# Patient Record
Sex: Female | Born: 1970 | Race: Black or African American | Hispanic: No | Marital: Married | Smoking: Never smoker
Health system: Southern US, Community
[De-identification: ages and names within clinical notes are randomized; demographics above are authoritative.]

---

## 1998-12-23 ENCOUNTER — Other Ambulatory Visit: Admission: RE | Admit: 1998-12-23 | Discharge: 1998-12-23 | Payer: Self-pay | Admitting: Family Medicine

## 1999-06-20 ENCOUNTER — Other Ambulatory Visit: Admission: RE | Admit: 1999-06-20 | Discharge: 1999-06-20 | Payer: Self-pay | Admitting: Family Medicine

## 2000-05-29 ENCOUNTER — Other Ambulatory Visit: Admission: RE | Admit: 2000-05-29 | Discharge: 2000-05-29 | Payer: Self-pay | Admitting: Family Medicine

## 2015-04-12 ENCOUNTER — Ambulatory Visit
Admit: 2015-04-12 | Disposition: A | Discharge status: Home / Self Care | Payer: Self-pay | Attending: Internal Medicine | Admitting: Internal Medicine

## 2015-04-26 ENCOUNTER — Ambulatory Visit
Admission: RE | Admit: 2015-04-26 | Discharge: 2015-04-26 | Disposition: A | Discharge status: Home / Self Care | Payer: Managed Care, Other (non HMO) | Source: Ambulatory Visit | Attending: Internal Medicine | Admitting: Internal Medicine

## 2015-04-26 DIAGNOSIS — Z1231 Encounter for screening mammogram for malignant neoplasm of breast: Secondary | ICD-10-CM | POA: Diagnosis not present

## 2015-04-26 DIAGNOSIS — Z1239 Encounter for other screening for malignant neoplasm of breast: Secondary | ICD-10-CM

## 2015-09-24 ENCOUNTER — Other Ambulatory Visit: Payer: Self-pay | Admitting: Internal Medicine

## 2015-09-24 DIAGNOSIS — Z1239 Encounter for other screening for malignant neoplasm of breast: Secondary | ICD-10-CM

## 2016-05-04 ENCOUNTER — Other Ambulatory Visit: Payer: Self-pay | Admitting: Internal Medicine

## 2016-05-04 DIAGNOSIS — Z1239 Encounter for other screening for malignant neoplasm of breast: Secondary | ICD-10-CM

## 2016-05-16 ENCOUNTER — Ambulatory Visit
Admission: RE | Admit: 2016-05-16 | Discharge: 2016-05-16 | Disposition: A | Discharge status: Home / Self Care | Payer: Managed Care, Other (non HMO) | Source: Ambulatory Visit | Attending: Internal Medicine | Admitting: Internal Medicine

## 2016-05-16 DIAGNOSIS — Z1239 Encounter for other screening for malignant neoplasm of breast: Secondary | ICD-10-CM

## 2016-05-16 DIAGNOSIS — Z1231 Encounter for screening mammogram for malignant neoplasm of breast: Secondary | ICD-10-CM | POA: Diagnosis present

## 2017-05-11 ENCOUNTER — Emergency Department
Admission: EM | Admit: 2017-05-11 | Discharge: 2017-05-11 | Disposition: A | Discharge status: Home / Self Care | Payer: Commercial Managed Care - PPO | Attending: Emergency Medicine | Admitting: Emergency Medicine

## 2017-05-11 ENCOUNTER — Encounter: Payer: Self-pay | Admitting: Emergency Medicine

## 2017-05-11 DIAGNOSIS — W57XXXA Bitten or stung by nonvenomous insect and other nonvenomous arthropods, initial encounter: Secondary | ICD-10-CM | POA: Insufficient documentation

## 2017-05-11 DIAGNOSIS — Y939 Activity, unspecified: Secondary | ICD-10-CM | POA: Insufficient documentation

## 2017-05-11 DIAGNOSIS — S20461A Insect bite (nonvenomous) of right back wall of thorax, initial encounter: Secondary | ICD-10-CM | POA: Insufficient documentation

## 2017-05-11 DIAGNOSIS — Y929 Unspecified place or not applicable: Secondary | ICD-10-CM | POA: Diagnosis not present

## 2017-05-11 DIAGNOSIS — Y999 Unspecified external cause status: Secondary | ICD-10-CM | POA: Insufficient documentation

## 2017-05-11 MED ORDER — DOXYCYCLINE HYCLATE 100 MG PO TABS
200.0000 mg | ORAL_TABLET | Freq: Once | ORAL | Status: AC
  Administered 2017-05-11: 200 mg via ORAL

## 2017-05-11 MED ORDER — DOXYCYCLINE HYCLATE 100 MG PO TABS
ORAL_TABLET | ORAL | Status: AC
  Filled 2017-05-11: qty 2

## 2017-05-11 NOTE — ED Triage Notes (Signed)
Noticed tic to right upper back on Wednesday.  Here for removal

## 2017-05-11 NOTE — ED Provider Notes (Signed)
[facility] Emergency Department Provider Note       Time seen: ----------------------------------------- 7:02 PM on 05/11/2017 -----------------------------------------     I have reviewed the triage vital signs and the nursing notes.   HISTORY   Chief Complaint Tick Removal    HPI Julie Hodges is a 46 y.o. female who presents to the ED for possible tick embedded in her right upper back. This was noticed by her on about Wednesday. Her husband states the head is likely still embedded. She is here for tick removal. She denies fevers, chills, rash or other complaints.   History reviewed. No pertinent past medical history.  There are no active problems to display for this patient.   History reviewed. No pertinent surgical history.  Allergies Patient has no known allergies.  Social History Social History  Substance Use Topics  . Smoking status: Never Smoker  . Smokeless tobacco: Never Used  . Alcohol use Not on file    Review of Systems Constitutional: Negative for fever. Cardiovascular: Negative for chest pain. Respiratory: Negative for shortness of breath. Gastrointestinal: Negative for abdominal pain, vomiting and diarrhea. Musculoskeletal: Negative for back pain. Skin: Possible foreign body in the right upper back Neurological: Negative for headaches, focal weakness or numbness.  All systems negative/normal/unremarkable except as stated in the HPI  ____________________________________________   PHYSICAL EXAM:  VITAL SIGNS: ED Triage Vitals  Enc Vitals Group     BP 05/11/17 1830 (!) 159/91     Pulse Rate 05/11/17 1830 99     Resp 05/11/17 1830 16     Temp 05/11/17 1830 98 F (36.7 C)     Temp Source 05/11/17 1830 Oral     SpO2 05/11/17 1830 99 %     Weight 05/11/17 1812 164 lb (74.4 kg)     Height 05/11/17 1812 5\' 1"  (1.549 m)     Head Circumference --      Peak Flow --      Pain Score --      Pain Loc --      Pain  Edu? --      Excl. in GC? --     Constitutional: Alert and oriented. Well appearing and in no distress. Eyes: Conjunctivae are normal. PERRL. Normal extraocular movements. Musculoskeletal: Nontender with normal range of motion in extremities. No lower extremity tenderness nor edema. Possible foreign body in the right upper back Neurologic:  Normal speech and language. No gross focal neurologic deficits are appreciated.  Skin:  Possible retained tick products in the right upper back ____________________________________________  ED COURSE:  Pertinent labs & imaging results that were available during my care of the patient were reviewed by me and considered in my medical decision making (see chart for details). Patient presents for tick bite.    .Foreign Body Removal Date/Time: 05/11/2017 7:23 PM Performed by: Emily Filbert Authorized by: Daryel November E  Consent: Verbal consent obtained. Anesthesia: local infiltration  Anesthesia: Local Anesthetic: lidocaine 1% without epinephrine Anesthetic total: 3 mL  Sedation: Patient sedated: no Patient restrained: no Complexity: simple 0 objects recovered. Comments: There was no foreign body to remove in the right upper back area    ____________________________________________  FINAL ASSESSMENT AND PLAN  Tick bite  Plan: Patient had presented for possible tick bite. She was given prophylactic dosing of doxycycline. No retained foreign body or part of the tick was identified. She is stable for discharge.   Emily Filbert, MD   Note: This note  was generated in part or whole with voice recognition software. Voice recognition is usually quite accurate but there are transcription errors that can and very often do occur. I apologize for any typographical errors that were not detected and corrected.     Emily FilbertWilliams, Sam Overbeck E, MD 05/11/17 [phone]

## 2017-11-30 ENCOUNTER — Other Ambulatory Visit: Payer: Self-pay | Admitting: Internal Medicine

## 2017-11-30 DIAGNOSIS — R590 Localized enlarged lymph nodes: Secondary | ICD-10-CM

## 2017-12-10 ENCOUNTER — Ambulatory Visit
Admission: RE | Admit: 2017-12-10 | Discharge: 2017-12-10 | Disposition: A | Discharge status: Home / Self Care | Payer: Commercial Managed Care - PPO | Source: Ambulatory Visit | Attending: Internal Medicine | Admitting: Internal Medicine

## 2017-12-10 DIAGNOSIS — R590 Localized enlarged lymph nodes: Secondary | ICD-10-CM | POA: Diagnosis not present

## 2017-12-10 DIAGNOSIS — R9389 Abnormal findings on diagnostic imaging of other specified body structures: Secondary | ICD-10-CM | POA: Diagnosis not present

## 2018-12-26 DIAGNOSIS — R7303 Prediabetes: Secondary | ICD-10-CM | POA: Diagnosis not present

## 2019-01-02 DIAGNOSIS — I10 Essential (primary) hypertension: Secondary | ICD-10-CM | POA: Diagnosis not present

## 2019-01-02 DIAGNOSIS — R7303 Prediabetes: Secondary | ICD-10-CM | POA: Diagnosis not present

## 2019-07-22 ENCOUNTER — Other Ambulatory Visit: Payer: Self-pay | Admitting: Physician Assistant

## 2019-07-22 DIAGNOSIS — Z1231 Encounter for screening mammogram for malignant neoplasm of breast: Secondary | ICD-10-CM

## 2020-04-20 ENCOUNTER — Ambulatory Visit
Admission: RE | Admit: 2020-04-20 | Discharge: 2020-04-20 | Disposition: A | Discharge status: Home / Self Care | Payer: Commercial Managed Care - PPO | Source: Ambulatory Visit | Attending: Physician Assistant | Admitting: Physician Assistant

## 2020-04-20 DIAGNOSIS — Z1231 Encounter for screening mammogram for malignant neoplasm of breast: Secondary | ICD-10-CM

## 2021-03-25 ENCOUNTER — Other Ambulatory Visit: Payer: Self-pay | Admitting: Physician Assistant

## 2021-03-25 DIAGNOSIS — Z1231 Encounter for screening mammogram for malignant neoplasm of breast: Secondary | ICD-10-CM

## 2021-04-21 ENCOUNTER — Ambulatory Visit
Admission: RE | Admit: 2021-04-21 | Discharge: 2021-04-21 | Disposition: A | Discharge status: Home / Self Care | Payer: Commercial Managed Care - PPO | Source: Ambulatory Visit | Attending: Physician Assistant | Admitting: Physician Assistant

## 2021-04-21 ENCOUNTER — Other Ambulatory Visit: Payer: Self-pay

## 2021-04-21 DIAGNOSIS — Z1231 Encounter for screening mammogram for malignant neoplasm of breast: Secondary | ICD-10-CM | POA: Insufficient documentation

## 2021-10-03 ENCOUNTER — Other Ambulatory Visit
Admission: RE | Admit: 2021-10-03 | Discharge: 2021-10-03 | Disposition: A | Discharge status: Home / Self Care | Payer: Commercial Managed Care - PPO | Source: Ambulatory Visit | Attending: Internal Medicine | Admitting: Internal Medicine

## 2021-10-03 DIAGNOSIS — S6992XA Unspecified injury of left wrist, hand and finger(s), initial encounter: Secondary | ICD-10-CM | POA: Insufficient documentation

## 2021-10-03 DIAGNOSIS — M79662 Pain in left lower leg: Secondary | ICD-10-CM | POA: Diagnosis not present

## 2021-10-03 LAB — D-DIMER, QUANTITATIVE: D-Dimer, Quant: 0.34 ug/mL-FEU (ref 0.00–0.50)

## 2021-11-08 ENCOUNTER — Other Ambulatory Visit: Payer: Self-pay | Admitting: Physician Assistant

## 2021-11-08 DIAGNOSIS — Z1231 Encounter for screening mammogram for malignant neoplasm of breast: Secondary | ICD-10-CM

## 2022-05-03 ENCOUNTER — Ambulatory Visit
Admission: RE | Admit: 2022-05-03 | Discharge: 2022-05-03 | Disposition: A | Discharge status: Home / Self Care | Payer: Commercial Managed Care - PPO | Source: Ambulatory Visit | Attending: Physician Assistant | Admitting: Physician Assistant

## 2022-05-03 DIAGNOSIS — Z1231 Encounter for screening mammogram for malignant neoplasm of breast: Secondary | ICD-10-CM | POA: Insufficient documentation

## 2023-02-26 ENCOUNTER — Other Ambulatory Visit: Payer: Self-pay | Admitting: Physician Assistant

## 2023-02-26 DIAGNOSIS — Z1231 Encounter for screening mammogram for malignant neoplasm of breast: Secondary | ICD-10-CM

## 2023-10-30 IMAGING — MG MM DIGITAL SCREENING BILAT W/ TOMO AND CAD
8 series · 8 of 24 positions shown · non-contrast
Comparison: Previous exam(s).

CLINICAL DATA: Screening.

EXAM:
DIGITAL SCREENING BILATERAL MAMMOGRAM WITH TOMOSYNTHESIS AND CAD
TECHNIQUE: Bilateral screening digital craniocaudal and mediolateral oblique
mammograms were obtained. Bilateral screening digital breast
tomosynthesis was performed. The images were evaluated with
computer-aided detection.

[R MLO synth-2D]
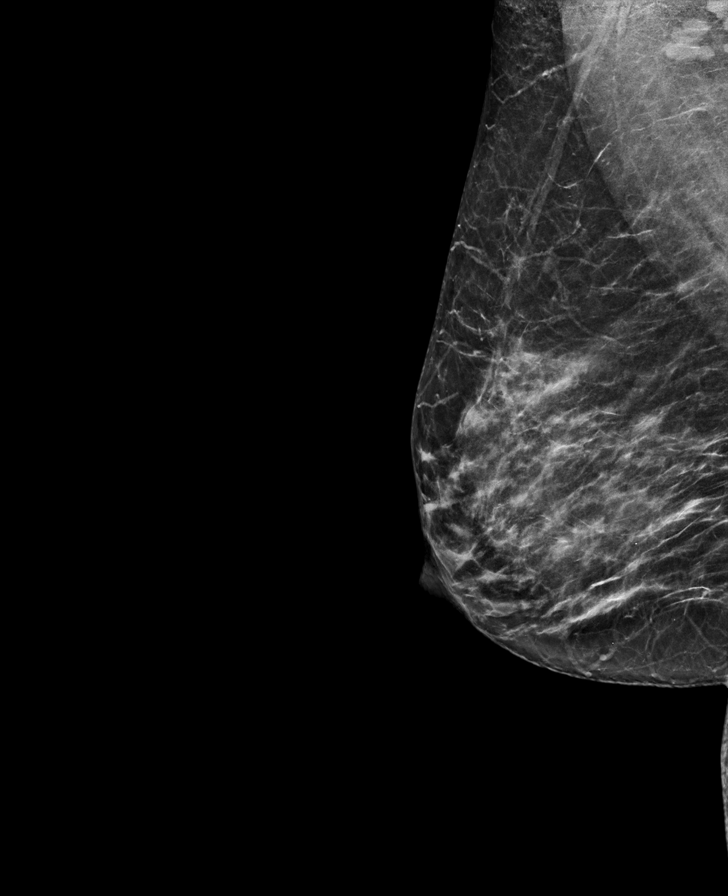

[R CC synth-2D]
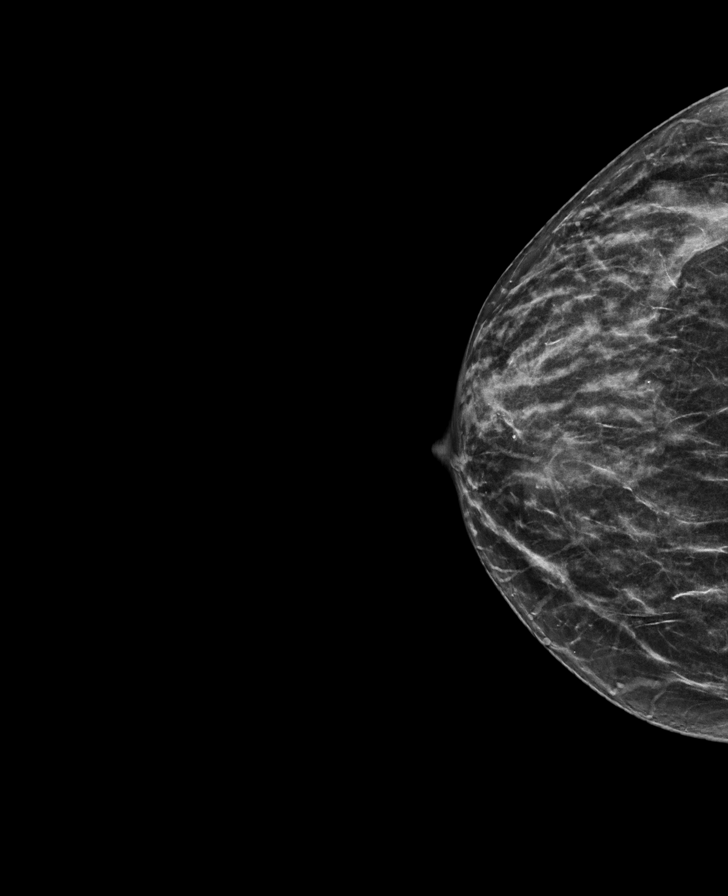

[L CC synth-2D]
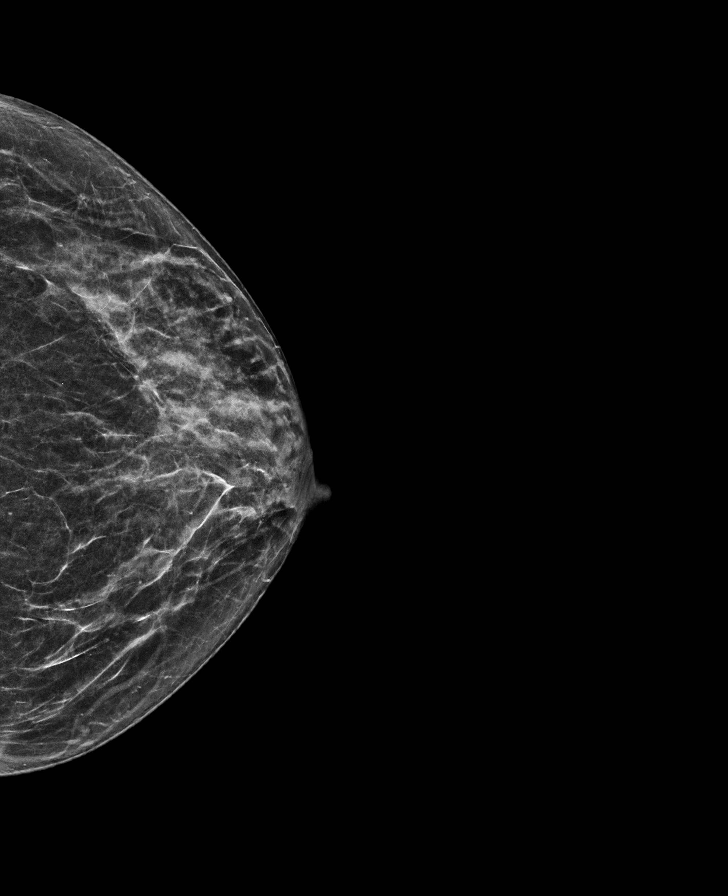

[L MLO synth-2D]
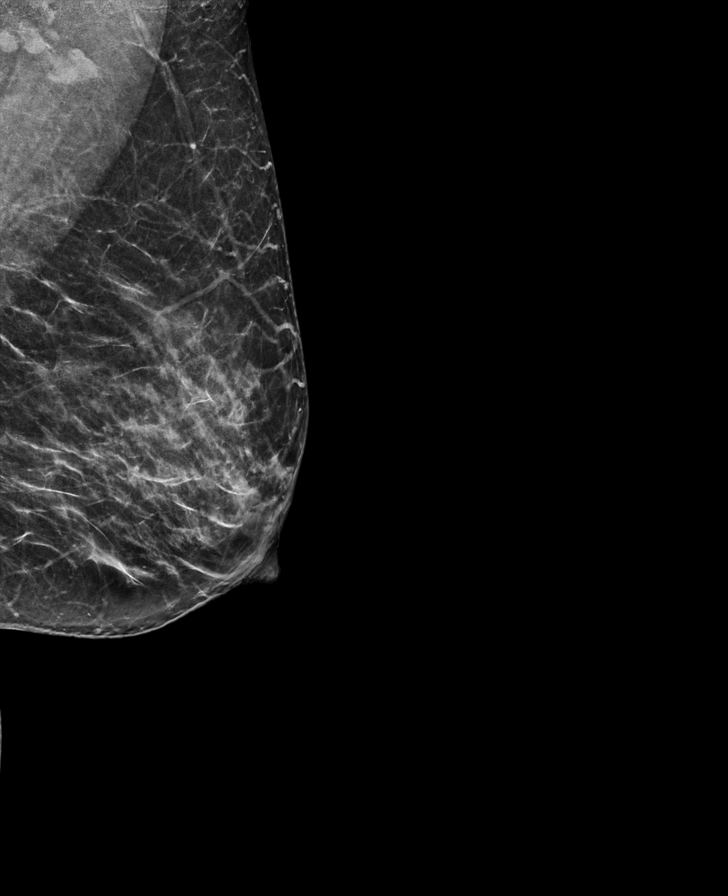

[L CC tomo · tomo slice 29/58.0]
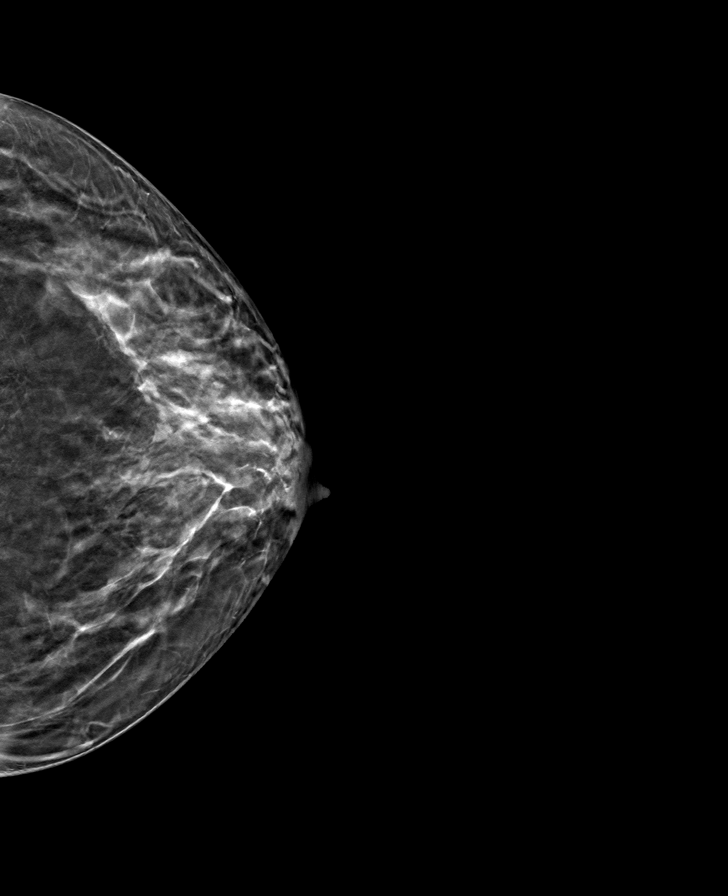

[R CC tomo · tomo slice 27/54.0]
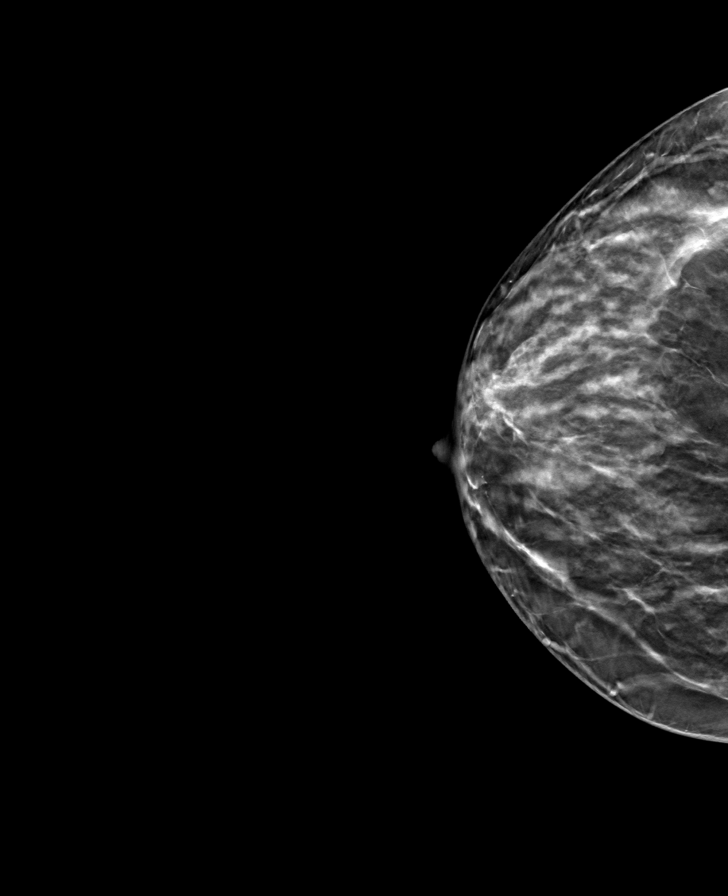

[L MLO tomo · tomo slice 31/61.0]
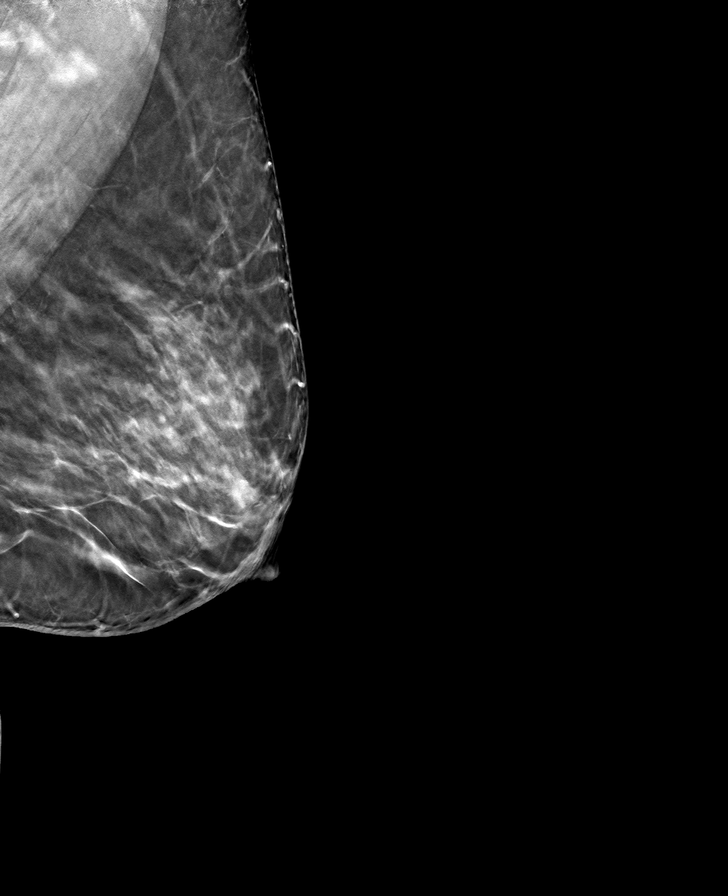

[R MLO tomo · tomo slice 33/66.0]
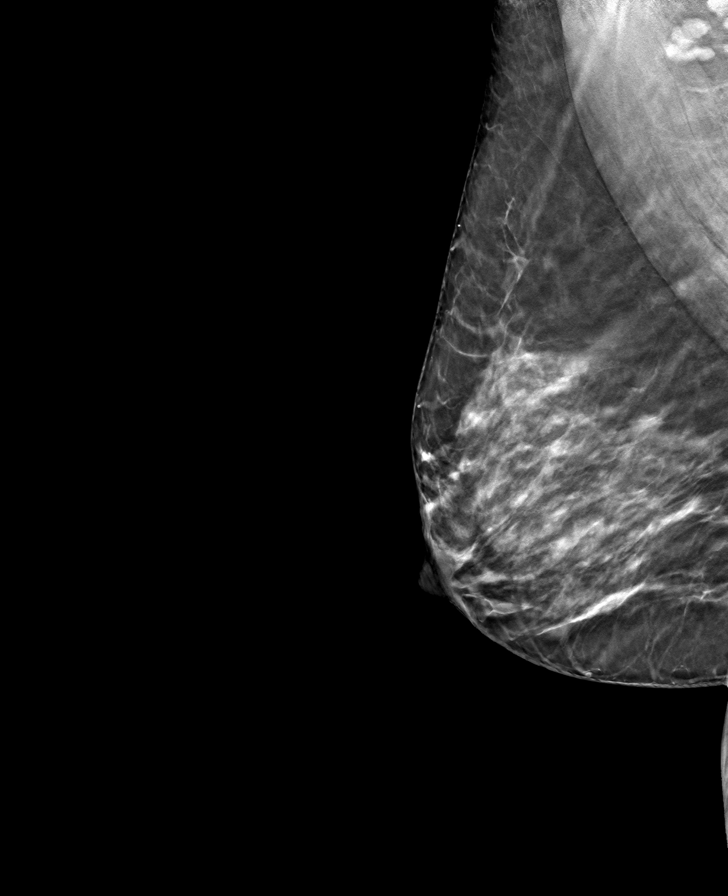

[8 of 24 positions shown; findings below may reference images not displayed]

ACR Breast Density Category c: The breast tissue is heterogeneously
dense, which may obscure small masses.
FINDINGS: There are no findings suspicious for malignancy.
IMPRESSION: No mammographic evidence of malignancy. A result letter of this
screening mammogram will be mailed directly to the patient.

RECOMMENDATION:
Screening mammogram in one year. (Code:Q3-W-BC3)

BI-RADS CATEGORY  1: Negative.

## 2024-07-16 ENCOUNTER — Other Ambulatory Visit: Payer: Self-pay | Admitting: Physician Assistant

## 2024-07-16 DIAGNOSIS — Z1231 Encounter for screening mammogram for malignant neoplasm of breast: Secondary | ICD-10-CM
